# Patient Record
Sex: Female | Born: 1985 | Race: Black or African American | Hispanic: No | Marital: Single | Smoking: Never smoker
Health system: Southern US, Community
[De-identification: ages and names within clinical notes are randomized; demographics above are authoritative.]

## PROBLEM LIST (undated history)

## (undated) HISTORY — PX: KNEE SURGERY: SHX244

---

## 2007-08-05 ENCOUNTER — Emergency Department (HOSPITAL_COMMUNITY): Admission: EM | Admit: 2007-08-05 | Discharge: 2007-08-06 | Payer: Self-pay | Admitting: Emergency Medicine

## 2014-06-03 ENCOUNTER — Emergency Department (HOSPITAL_BASED_OUTPATIENT_CLINIC_OR_DEPARTMENT_OTHER)
Admission: EM | Admit: 2014-06-03 | Discharge: 2014-06-03 | Disposition: A | Discharge status: Home / Self Care | Payer: 59 | Attending: Emergency Medicine | Admitting: Emergency Medicine

## 2014-06-03 ENCOUNTER — Encounter (HOSPITAL_BASED_OUTPATIENT_CLINIC_OR_DEPARTMENT_OTHER): Payer: Self-pay | Admitting: *Deleted

## 2014-06-03 ENCOUNTER — Emergency Department (HOSPITAL_BASED_OUTPATIENT_CLINIC_OR_DEPARTMENT_OTHER): Payer: 59

## 2014-06-03 DIAGNOSIS — M791 Myalgia: Secondary | ICD-10-CM | POA: Diagnosis not present

## 2014-06-03 DIAGNOSIS — R509 Fever, unspecified: Secondary | ICD-10-CM | POA: Diagnosis present

## 2014-06-03 DIAGNOSIS — J069 Acute upper respiratory infection, unspecified: Secondary | ICD-10-CM | POA: Diagnosis not present

## 2014-06-03 DIAGNOSIS — R05 Cough: Secondary | ICD-10-CM

## 2014-06-03 DIAGNOSIS — R059 Cough, unspecified: Secondary | ICD-10-CM

## 2014-06-03 LAB — URINALYSIS, ROUTINE W REFLEX MICROSCOPIC
Bilirubin Urine: NEGATIVE
Glucose, UA: NEGATIVE mg/dL
HGB URINE DIPSTICK: NEGATIVE
Ketones, ur: NEGATIVE mg/dL
Leukocytes, UA: NEGATIVE
NITRITE: NEGATIVE
PROTEIN: NEGATIVE mg/dL
Specific Gravity, Urine: 1.023 (ref 1.005–1.030)
UROBILINOGEN UA: 1 mg/dL (ref 0.0–1.0)
pH: 6.5 (ref 5.0–8.0)

## 2014-06-03 LAB — PREGNANCY, URINE: Preg Test, Ur: NEGATIVE

## 2014-06-03 MED ORDER — IPRATROPIUM-ALBUTEROL 0.5-2.5 (3) MG/3ML IN SOLN
3.0000 mL | RESPIRATORY_TRACT | Status: DC
  Administered 2014-06-03: 3 mL via RESPIRATORY_TRACT
  Filled 2014-06-03: qty 3

## 2014-06-03 MED ORDER — IBUPROFEN 800 MG PO TABS: 800.0000 mg | ORAL_TABLET | Freq: Three times a day (TID) | ORAL | Status: DC

## 2014-06-03 MED ORDER — KETOROLAC TROMETHAMINE 60 MG/2ML IM SOLN
60.0000 mg | Freq: Once | INTRAMUSCULAR | Status: AC
  Administered 2014-06-03: 60 mg via INTRAMUSCULAR
  Filled 2014-06-03: qty 2

## 2014-06-03 MED ORDER — ACETAMINOPHEN 325 MG PO TABS
650.0000 mg | ORAL_TABLET | Freq: Once | ORAL | Status: AC
  Administered 2014-06-03: 650 mg via ORAL
  Filled 2014-06-03: qty 2

## 2014-06-03 NOTE — Discharge Instructions (Signed)
Upper Respiratory Infection, Adult °An upper respiratory infection (URI) is also sometimes known as the common cold. The upper respiratory tract includes the nose, sinuses, throat, trachea, and bronchi. Bronchi are the airways leading to the lungs. Most people improve within 1 week, but symptoms can last up to 2 weeks. A residual cough may last even longer.  °CAUSES °Many different viruses can infect the tissues lining the upper respiratory tract. The tissues become irritated and inflamed and often become very moist. Mucus production is also common. A cold is contagious. You can easily spread the virus to others by oral contact. This includes kissing, sharing a glass, coughing, or sneezing. Touching your mouth or nose and then touching a surface, which is then touched by another person, can also spread the virus. °SYMPTOMS  °Symptoms typically develop 1 to 3 days after you come in contact with a cold virus. Symptoms vary from person to person. They may include: °· Runny nose. °· Sneezing. °· Nasal congestion. °· Sinus irritation. °· Sore throat. °· Loss of voice (laryngitis). °· Cough. °· Fatigue. °· Muscle aches. °· Loss of appetite. °· Headache. °· Low-grade fever. °DIAGNOSIS  °You might diagnose your own cold based on familiar symptoms, since most people get a cold 2 to 3 times a year. Your caregiver can confirm this based on your exam. Most importantly, your caregiver can check that your symptoms are not due to another disease such as strep throat, sinusitis, pneumonia, asthma, or epiglottitis. Blood tests, throat tests, and X-rays are not necessary to diagnose a common cold, but they may sometimes be helpful in excluding other more serious diseases. Your caregiver will decide if any further tests are required. °RISKS AND COMPLICATIONS  °You may be at risk for a more severe case of the common cold if you smoke cigarettes, have chronic heart disease (such as heart failure) or lung disease (such as asthma), or if  you have a weakened immune system. The very young and very old are also at risk for more serious infections. Bacterial sinusitis, middle ear infections, and bacterial pneumonia can complicate the common cold. The common cold can worsen asthma and chronic obstructive pulmonary disease (COPD). Sometimes, these complications can require emergency medical care and may be life-threatening. °PREVENTION  °The best way to protect against getting a cold is to practice good hygiene. Avoid oral or hand contact with people with cold symptoms. Wash your hands often if contact occurs. There is no clear evidence that vitamin C, vitamin E, echinacea, or exercise reduces the chance of developing a cold. However, it is always recommended to get plenty of rest and practice good nutrition. °TREATMENT  °Treatment is directed at relieving symptoms. There is no cure. Antibiotics are not effective, because the infection is caused by a virus, not by bacteria. Treatment may include: °· Increased fluid intake. Sports drinks offer valuable electrolytes, sugars, and fluids. °· Breathing heated mist or steam (vaporizer or shower). °· Eating chicken soup or other clear broths, and maintaining good nutrition. °· Getting plenty of rest. °· Using gargles or lozenges for comfort. °· Controlling fevers with ibuprofen or acetaminophen as directed by your caregiver. °· Increasing usage of your inhaler if you have asthma. °Zinc gel and zinc lozenges, taken in the first 24 hours of the common cold, can shorten the duration and lessen the severity of symptoms. Pain medicines may help with fever, muscle aches, and throat pain. A variety of non-prescription medicines are available to treat congestion and runny nose. Your caregiver   can make recommendations and may suggest nasal or lung inhalers for other symptoms.  °HOME CARE INSTRUCTIONS  °· Only take over-the-counter or prescription medicines for pain, discomfort, or fever as directed by your  caregiver. °· Use a warm mist humidifier or inhale steam from a shower to increase air moisture. This may keep secretions moist and make it easier to breathe. °· Drink enough water and fluids to keep your urine clear or pale yellow. °· Rest as needed. °· Return to work when your temperature has returned to normal or as your caregiver advises. You may need to stay home longer to avoid infecting others. You can also use a face mask and careful hand washing to prevent spread of the virus. °SEEK MEDICAL CARE IF:  °· After the first few days, you feel you are getting worse rather than better. °· You need your caregiver's advice about medicines to control symptoms. °· You develop chills, worsening shortness of breath, or Ramiro or red sputum. These may be signs of pneumonia. °· You develop yellow or Sopko nasal discharge or pain in the face, especially when you bend forward. These may be signs of sinusitis. °· You develop a fever, swollen neck glands, pain with swallowing, or white areas in the back of your throat. These may be signs of strep throat. °SEEK IMMEDIATE MEDICAL CARE IF:  °· You have a fever. °· You develop severe or persistent headache, ear pain, sinus pain, or chest pain. °· You develop wheezing, a prolonged cough, cough up blood, or have a change in your usual mucus (if you have chronic lung disease). °· You develop sore muscles or a stiff neck. °Document Released: 08/31/2000 Document Revised: 05/30/2011 Document Reviewed: 06/12/2013 °ExitCare® Patient Information ©2015 [employer]. This information is not intended to replace advice given to you by your health care provider. Make sure you discuss any questions you have with your health care provider. ° °Emergency Department Resource Guide °1) Find a Doctor and Pay Out of Pocket °Although you won't have to find out who is covered by your insurance plan, it is a good idea to ask around and get recommendations. You will then need to call the office and see if  the doctor you have chosen will accept you as a new patient and what types of options they offer for patients who are self-pay. Some doctors offer discounts or will set up payment plans for their patients who do not have insurance, but you will need to ask so you aren't surprised when you get to your appointment. ° °2) Contact Your Local Health Department °Not all health departments have doctors that can see patients for sick visits, but many do, so it is worth a call to see if yours does. If you don't know where your local health department is, you can check in your phone book. The CDC also has a tool to help you locate your state's health department, and many state websites also have listings of all of their local health departments. ° °3) Find a Walk-in Clinic °If your illness is not likely to be very severe or complicated, you may want to try a walk in clinic. These are popping up all over the country in pharmacies, drugstores, and shopping centers. They're usually staffed by nurse practitioners or physician assistants that have been trained to treat common illnesses and complaints. They're usually fairly quick and inexpensive. However, if you have serious medical issues or chronic medical problems, these are probably not your best option. ° °  No Primary Care Doctor: °- Call [facility] at  832-8000 - they can help you locate a primary care doctor that  accepts your insurance, provides certain services, etc. °- Physician Referral Service- 1-800-533-3463 ° °Chronic Pain Problems: °Organization         Address  Phone   Notes  °[facility]  (336) 297-2271 Patients need to be referred by their primary care doctor.  ° °Medication Assistance: °Organization         Address  Phone   Notes  °[place] [facility] [address] °[place], [place] [address] (336) 641-8030 --Must be a resident of [place] °-- Must have NO insurance coverage whatsoever (no  Medicaid/ Medicare, etc.) °-- The pt. MUST have a primary care doctor that directs their care regularly and follows them in the community °  °[facility]  (866) 331-1348   °[employer]  (888) 892-1162   ° °Agencies that provide inexpensive medical care: °Organization         Address  Phone   Notes  °[facility]  (336) 832-8035   °[facility]    (336) 832-7272   °[facility] [address] °[place], [place] [address] (336) 832-4777   °[facility] [address], °[place] (336) 271-4999   °[facility]    (336) 373-0678   °[facility]    (336) 272-1050   °[facility] ° [address], [place] Phone:  (336) 832-4444, Fax:  (336) 832-4440 Hours of Operation:  9 am - 6 pm, M-F.  Also accepts Medicaid/Medicare and self-pay.  °[facility] ° [address], [place] Phone: (336) 832-3150, Fax: (336) 832-3151. Hours of Operation:  8:30 am - 5:30 pm, M-F.  Also accepts Medicaid and self-pay.  °[facility] [place] [address], [place] Phone: (336) 878-6027   °[facility] [address], [place], [place] (336)723-1848, Ext. 123 Mondays & Thursdays: 7-9 AM.  First 15 patients are seen on a first come, first serve basis. °  ° °Medicaid-accepting [place] Providers: ° °Organization         Address  Phone   Notes  °[facility] [address], [place] (336) 641-2100 Also accepts self-pay patients.  °[facility] [address], [place] ° (336) 856-9996   °[facility] [address], [place] (336) 288-8857   °[facility] [address] [place] [address], [place] (336) 299-7000   °Veita Bland [address], [place]  ° (336) 373-1557 Only accepts [place] Medicaid patients after they have their name applied to their card.   ° °Self-Pay (no insurance) in [place]: ° °Organization         Address  Phone   Notes  °Sickle Cell Patients, [facility] [address], [place] (336) 832-1970   °[facility] [address], [place] (336) 832-4400   °[facility] ° [address], [place] (336) 992-4800   °[facility]/Dr. Osei-Bonsu ° [address] [place] [address], [place] or [address], [place] (336) 841-8500 Phone number for both [place] and [place] locations is the same.  °[facility] [address], [place] (336) 299-0000   °  [facility] [address] [place] [address], [place] or [address] (336) 852-7530 °(336) 878-2260   °[facility] [address], [place] (336) 350-1642, phone; (336) 294-5005, fax Sees patients 1st and 3rd Saturday of every month.  Must not qualify for public or private insurance (i.e. Medicaid, Medicare, [place] Health Choice, Veterans' Benefits) • Household income should be no more than 200% of the poverty level •The clinic cannot treat you if you are pregnant or think you are pregnant • Sexually transmitted diseases are not treated at the clinic.  ° ° °Dental Care: °Organization         Address  Phone  Notes  °[facility] [address], [place] (336) 641-6152 Accepts children up to age 21 who are enrolled in Medicaid or [place] Health Choice; pregnant women with a Medicaid card; and children who have applied for Medicaid or [place] Health Choice, but were declined, whose parents can pay a reduced fee at time of service.  °[facility] [place]  [address], [place] (336) 641-7733 Accepts children up to age 21 who are enrolled in Medicaid or [place] Health Choice; pregnant women with a Medicaid card; and children who have applied for Medicaid or [place] Health Choice,  but were declined, whose parents can pay a reduced fee at time of service.  °[facility] ° [address], [place] (336) 641-4533 Patients are seen by appointment only. Walk-ins are not accepted. [facility] will see patients 18 years of age and older. °Monday - Tuesday (8am-5pm) °Most Wednesdays (8:30-5pm) °$30 per visit, cash only  °[facility] ° [address], [place] (336) 641-4533 Patients are seen by appointment only. Walk-ins are not accepted. [facility] will see patients 18 years of age and older. °One Wednesday Evening (Monthly: Volunteer Based).  $30 per visit, cash only  °[facility]  (919) 537-3737 for adults; Children under age 4, call Graduate Pediatric Dentistry at (919) [phone]. Children aged 4-14, please call (919) [phone] to request a pediatric application. ° Dental services are provided in all areas of dental care including fillings, crowns and bridges, complete and partial dentures, implants, gum treatment, root canals, and extractions. Preventive care is also provided. Treatment is provided to both adults and children. °Patients are selected via a lottery and there is often a waiting list. °  °[facility] [address], °[place] ° (336) 763-8833 [email or web address] °  °[facility] [address], [place], [place] (336)723-1848, Ext. 123 Second and Fourth Thursday of each month, opens at 6:30 AM; Clinic ends at 9 AM.  Patients are seen on a first-come first-served basis, and a limited number are seen during each clinic.  ° °[facility] ° [address], [place], [place] (336) 723-7904   Eligibility Requirements °You must have lived in [place], [place], or [place] counties for at least the last three months. °  You cannot be eligible for state or federal sponsored healthcare insurance, including Veterans Administration, Medicaid, or Medicare. °  You generally  cannot be eligible for healthcare insurance through your employer.  °  How to apply: °Eligibility screenings are held every Tuesday and Wednesday afternoon from 1:00 pm until 4:00 pm. You do not need an appointment for the interview!  °[facility] [address], [place], [place] 336-631-2330   °  [facility] [facility]  336-342-8273   °[facility]  336-703-3100   °[facility]  336-570-6415   ° °Behavioral Health Resources in the Community: °Intensive Outpatient Programs °Organization         Address  Phone  Notes  °[facility] [address], [place], [place] 336-878-6098   °[facility] [address], [place], [place] 336-832-9800   °[facility] [address], [place], [place] ° 336-882-2125   °[facility] [address],  °[place], [place] 1-800-853-5163 or 336-641-4981   °Substance Abuse Resources °Organization         Address  Phone  Notes  °[facility]  336-882-2125   °[facility]  336-784-9470   °[facility]  336-285-9073   °[facility]  336-845-3988   °Residential & Outpatient Substance Abuse Program  1-800-659-3381   °Psychological Services °Organization         Address  Phone  Notes  °[facility]  336- 832-9600   °[facility]  336- 378-7881   °[facility] [address], [place] 1-800-853-5163 or 336-641-4981   ° °Mobile Crisis Teams °Organization         Address  Phone  Notes  °[facility], Mobile Crisis Care Unit  1-877-626-1772   °Assertive °[facility] ° [address] [place], [place] 336-834-9664   °Sharon DeEsch [address] °[place] 336-554-5454   ° °Self-Help/Support Groups °Organization         Address  Phone             Notes  °[facility] - variety of support groups  336- 373-1402 Call for more  information  °Narcotics Anonymous (NA), [facility] [address], °[place]  2 meetings at this location  ° °Residential Treatment Programs °Organization         Address  Phone  Notes  °[facility] [address],    °[place]  1-866-801-8205   °[facility] ° [address], [place], [place] 704-293-8524   °[facility] [address], [place] 336-845-3988 Admissions: 8am-3pm M-F  °[facility] [address]    °[place], [place] 336-841-1104   °[facility] [address], [place], [place] 336-379-7146   °[facility] [address]  °[place], [place] 336-285-9073   °[facility] - Intensive Outpatient [address], [place], [place] 336-852-3033   °[facility] [address]  °[place], [place] 1-877-615-2722 or 336-784-9470   °[facility] [address] [place], [place] 336-227-7417 Accepts Medicaid  °[facility] [address]  °[place] 1-800-659-3381 Substance Abuse/Addiction Treatment  ° °[place] Behavioral Health Resources °Organization         Address  Phone  Notes  °[facility]  (888) 581-9988   °Julie Brannon, PhD [address] [place], [place]   (336) 349-5553 or (336) 951-0000   °[facility]   [address] °[place], [place] (336) 349-4454   °[facility] [address], [place], [place] (336) 342-8316 Insurance/Medicaid/sponsorship through [facility]  °[facility] [address]                                      [place], [place] (336) 342-8316 Therapy/tele-psych/case  °[facility] [address]  ° [place], [place] (336) 349-2233    °Dr. Arfeen  (336) 349-4544   °[facility] [place]  [employer] [place] [facility] 1) [address], [place] °2) [address], [place] °3)  [address], [place] (336)  349-3220 °(336) 342-7768 ° °(336) 342-8140   °[place] [facility] (336) 342-1394 or (336) 342-3537 (After Hours)    ° ° °

## 2014-06-03 NOTE — ED Notes (Signed)
Ice water given per pt request

## 2014-06-03 NOTE — ED Provider Notes (Signed)
CSN: [id number]     Arrival date & time 06/03/14  1523 History   First MD Initiated Contact with Patient 06/03/14 1536     Chief Complaint  Patient presents with  . Fever     (Consider location/radiation/quality/duration/timing/severity/associated sxs/prior Treatment) Patient is a 29 y.o. female presenting with fever. The history is provided by the patient.  Fever Temp source:  Subjective Severity:  Moderate Onset quality:  Gradual Duration:  1 day Timing:  Constant Progression:  Unchanged Chronicity:  New Relieved by:  Nothing Worsened by:  Nothing tried Associated symptoms: chills, cough and myalgias (diffuse)   Associated symptoms: no congestion, no rash, no rhinorrhea, no sore throat and no vomiting     History reviewed. No pertinent past medical history. Past Surgical History  Procedure Laterality Date  . Knee surgery     No family history on file. History  Substance Use Topics  . Smoking status: Never Smoker   . Smokeless tobacco: Not on file  . Alcohol Use: Yes   OB History    No data available     Review of Systems  Constitutional: Positive for fever and chills.  HENT: Negative for congestion, rhinorrhea and sore throat.   Respiratory: Positive for cough and shortness of breath.   Gastrointestinal: Negative for vomiting and abdominal pain.  Musculoskeletal: Positive for myalgias (diffuse).  Skin: Negative for rash.  All other systems reviewed and are negative.     Allergies  Review of patient's allergies indicates no known allergies.  Home Medications   Prior to Admission medications   Not on File   BP 128/84 mmHg  Pulse 92  Temp(Src) 102 F (38.9 C) (Oral)  Resp 18  Ht 5\' 3"  (1.6 m)  Wt 265 lb (120.203 kg)  BMI 46.95 kg/m2  SpO2 100%  LMP 05/27/2014 Physical Exam  Constitutional: She is oriented to person, place, and time. She appears well-developed and well-nourished. No distress.  HENT:  Head: Normocephalic and atraumatic.   Mouth/Throat: Oropharynx is clear and moist.  Eyes: EOM are normal. Pupils are equal, round, and reactive to light.  Neck: Normal range of motion. Neck supple.  Cardiovascular: Normal rate and regular rhythm.  Exam reveals no friction rub.   No murmur heard. Pulmonary/Chest: Effort normal. No respiratory distress. She has wheezes (very mild, occasional). She has no rales.  Abdominal: Soft. She exhibits no distension. There is no tenderness. There is no rebound.  Musculoskeletal: Normal range of motion. She exhibits no edema.  Neurological: She is alert and oriented to person, place, and time.  Skin: No rash noted. She is not diaphoretic.  Nursing note and vitals reviewed.   ED Course  Procedures (including critical care time) Labs Review Labs Reviewed  URINALYSIS, ROUTINE W REFLEX MICROSCOPIC  PREGNANCY, URINE    Imaging Review Dg Chest 2 View  06/03/2014   CLINICAL DATA:  Shortness of breath with fever and cough 2 days.  EXAM: CHEST  2 VIEW  COMPARISON:  None.  FINDINGS: Lungs are adequately inflated without focal consolidation or effusion. Cardiomediastinal silhouette, bones and soft tissues are within normal.  IMPRESSION: No active cardiopulmonary disease.   Electronically Signed   By: Elberta Fortisaniel  Boyle M.D.   On: 06/03/2014 16:39     EKG Interpretation None      MDM   Final diagnoses:  Cough  Viral upper respiratory illness    29 year old female here with cough, fever, headache, bodyaches. No sick contacts with similar illness. Began yesterday. She's coughing a  lot during our exam. She feels that she is wheezing. She has no asthma, COPD, smoking history. Here febrile, otherwise vitals are stable. She is well-appearing. Neck is supple with full range of motion without any nuchal rigidity. No meningeal signs. No concern for meningitis. Mild erythema posterior oropharynx, but she denies any sore throat. Lungs with a very occasional wheeze, no adventitious sounds otherwise. I  think she has an influenza type illness. Offered IV fluids, patient would rather take by mouth. Will give 60 mg IM of Toradol and check a chest x-ray. CXR clear. Feeling better after PO hydration and toradol. Counseled on supportive care. Stable for discharge.   Elwin Mocha, MD 06/03/14 [phone]

## 2014-06-03 NOTE — ED Notes (Signed)
Fever, headache, body aches since yesterday.

## 2015-04-11 ENCOUNTER — Encounter (HOSPITAL_BASED_OUTPATIENT_CLINIC_OR_DEPARTMENT_OTHER): Payer: Self-pay | Admitting: *Deleted

## 2015-04-11 ENCOUNTER — Emergency Department (HOSPITAL_BASED_OUTPATIENT_CLINIC_OR_DEPARTMENT_OTHER)
Admission: EM | Admit: 2015-04-11 | Discharge: 2015-04-11 | Disposition: A | Discharge status: Home / Self Care | Payer: 59 | Attending: Emergency Medicine | Admitting: Emergency Medicine

## 2015-04-11 ENCOUNTER — Emergency Department (HOSPITAL_BASED_OUTPATIENT_CLINIC_OR_DEPARTMENT_OTHER): Payer: 59

## 2015-04-11 DIAGNOSIS — Z23 Encounter for immunization: Secondary | ICD-10-CM | POA: Insufficient documentation

## 2015-04-11 DIAGNOSIS — Y9389 Activity, other specified: Secondary | ICD-10-CM | POA: Insufficient documentation

## 2015-04-11 DIAGNOSIS — Y998 Other external cause status: Secondary | ICD-10-CM | POA: Diagnosis not present

## 2015-04-11 DIAGNOSIS — W1839XA Other fall on same level, initial encounter: Secondary | ICD-10-CM | POA: Diagnosis not present

## 2015-04-11 DIAGNOSIS — Z791 Long term (current) use of non-steroidal anti-inflammatories (NSAID): Secondary | ICD-10-CM | POA: Insufficient documentation

## 2015-04-11 DIAGNOSIS — R52 Pain, unspecified: Secondary | ICD-10-CM

## 2015-04-11 DIAGNOSIS — Y9289 Other specified places as the place of occurrence of the external cause: Secondary | ICD-10-CM | POA: Diagnosis not present

## 2015-04-11 DIAGNOSIS — W19XXXA Unspecified fall, initial encounter: Secondary | ICD-10-CM

## 2015-04-11 DIAGNOSIS — S6391XA Sprain of unspecified part of right wrist and hand, initial encounter: Secondary | ICD-10-CM | POA: Insufficient documentation

## 2015-04-11 DIAGNOSIS — S6991XA Unspecified injury of right wrist, hand and finger(s), initial encounter: Secondary | ICD-10-CM | POA: Diagnosis present

## 2015-04-11 MED ORDER — TETANUS-DIPHTH-ACELL PERTUSSIS 5-2.5-18.5 LF-MCG/0.5 IM SUSP
0.5000 mL | Freq: Once | INTRAMUSCULAR | Status: AC
  Administered 2015-04-11: 0.5 mL via INTRAMUSCULAR
  Filled 2015-04-11: qty 0.5

## 2015-04-11 MED ORDER — HYDROCODONE-ACETAMINOPHEN 5-325 MG PO TABS: 1.0000 | ORAL_TABLET | Freq: Four times a day (QID) | ORAL | Status: AC | PRN

## 2015-04-11 NOTE — ED Notes (Signed)
Pt fell c/o rt hand pain  Slight swelling  Ice pack applied

## 2015-04-11 NOTE — ED Notes (Signed)
Pt fell landing on rt hand  Increased pain  Slight swelling   Denies any other inj

## 2015-04-11 NOTE — ED Provider Notes (Signed)
CSN: [id number]     Arrival date & time April 28, 2015  0330 History   First MD Initiated Contact with Patient 04-28-2015 0350     Chief Complaint  Patient presents with  . Hand Injury     (Consider location/radiation/quality/duration/timing/severity/associated sxs/prior Treatment) HPI  This is a 30 year old female who fell about 245 this morning. She fell onto her outstretched right hand. She is having pain in her right thenar eminence and thumb. Pain is worse with movement or palpation. She rates it as a 9 out of 10. There is minimal associated swelling. She denies other injury.  History reviewed. No pertinent past medical history. Past Surgical History  Procedure Laterality Date  . Knee surgery     No family history on file. Social History  Substance Use Topics  . Smoking status: Never Smoker   . Smokeless tobacco: None  . Alcohol Use: Yes   OB History    No data available     Review of Systems  All other systems reviewed and are negative.   Allergies  Review of patient's allergies indicates no known allergies.  Home Medications   Prior to Admission medications   Medication Sig Start Date End Date Taking? Authorizing Provider  ibuprofen (ADVIL,MOTRIN) 800 MG tablet Take 1 tablet (800 mg total) by mouth 3 (three) times daily. 06/03/14   Elwin Mocha, MD   BP 130/66 mmHg  Pulse 98  Temp(Src) 98.6 F (37 C) (Oral)  Resp 20  Ht  (1.6 m)  Wt 270 lb (122.471 kg)  BMI 47.84 kg/m2  SpO2 100%   Physical Exam  General: Well-developed, well-nourished female in no acute distress; appearance consistent with age of record HENT: normocephalic; atraumatic Eyes: pupils equal, round and reactive to light; extraocular muscles intact Neck: supple; no C-spine tenderness Heart: regular rate and rhythm Lungs: clear to auscultation bilaterally Abdomen: soft; nondistended; nontender Extremities: No deformity; full range of motion; pulses normal; tenderness of right thenar eminence  and anatomic snuffbox Neurologic: Awake, alert and oriented; motor function intact in all extremities and symmetric; no facial droop Skin: Warm and dry; superficial laceration of the right palm just proximal to the second MCP joint Psychiatric: Normal mood and affect    ED Course  Procedures (including critical care time)   MDM  Nursing notes and vitals signs, including pulse oximetry, reviewed.  Summary of this visit's results, reviewed by myself:  Imaging Studies: Dg Hand Complete Right  2015/04/28  CLINICAL DATA:  30 year old female with fall and trauma to the base of the first metacarpal. EXAM: RIGHT HAND - COMPLETE 3+ VIEW COMPARISON:  Radiograph dated 08/06/2007 FINDINGS: There is no evidence of fracture or dislocation. There is no evidence of arthropathy or other focal bone abnormality. Soft tissues are unremarkable. IMPRESSION: Negative. Electronically Signed   By: Elgie Collard M.D.   On: Apr 28, 2015 04:31   Will treat for possible occult navicular fracture.    Paula Libra, MD 28-Apr-2015 [phone]

## 2017-11-25 IMAGING — CR DG HAND COMPLETE 3+V*R*
3 series · 3 of 3 positions shown · non-contrast
Comparison: Radiograph dated 08/06/2007

CLINICAL DATA: 29-year-old female with fall and trauma to the base
of the first metacarpal.

EXAM:
RIGHT HAND - COMPLETE 3+ VIEW

[x hand pa right]
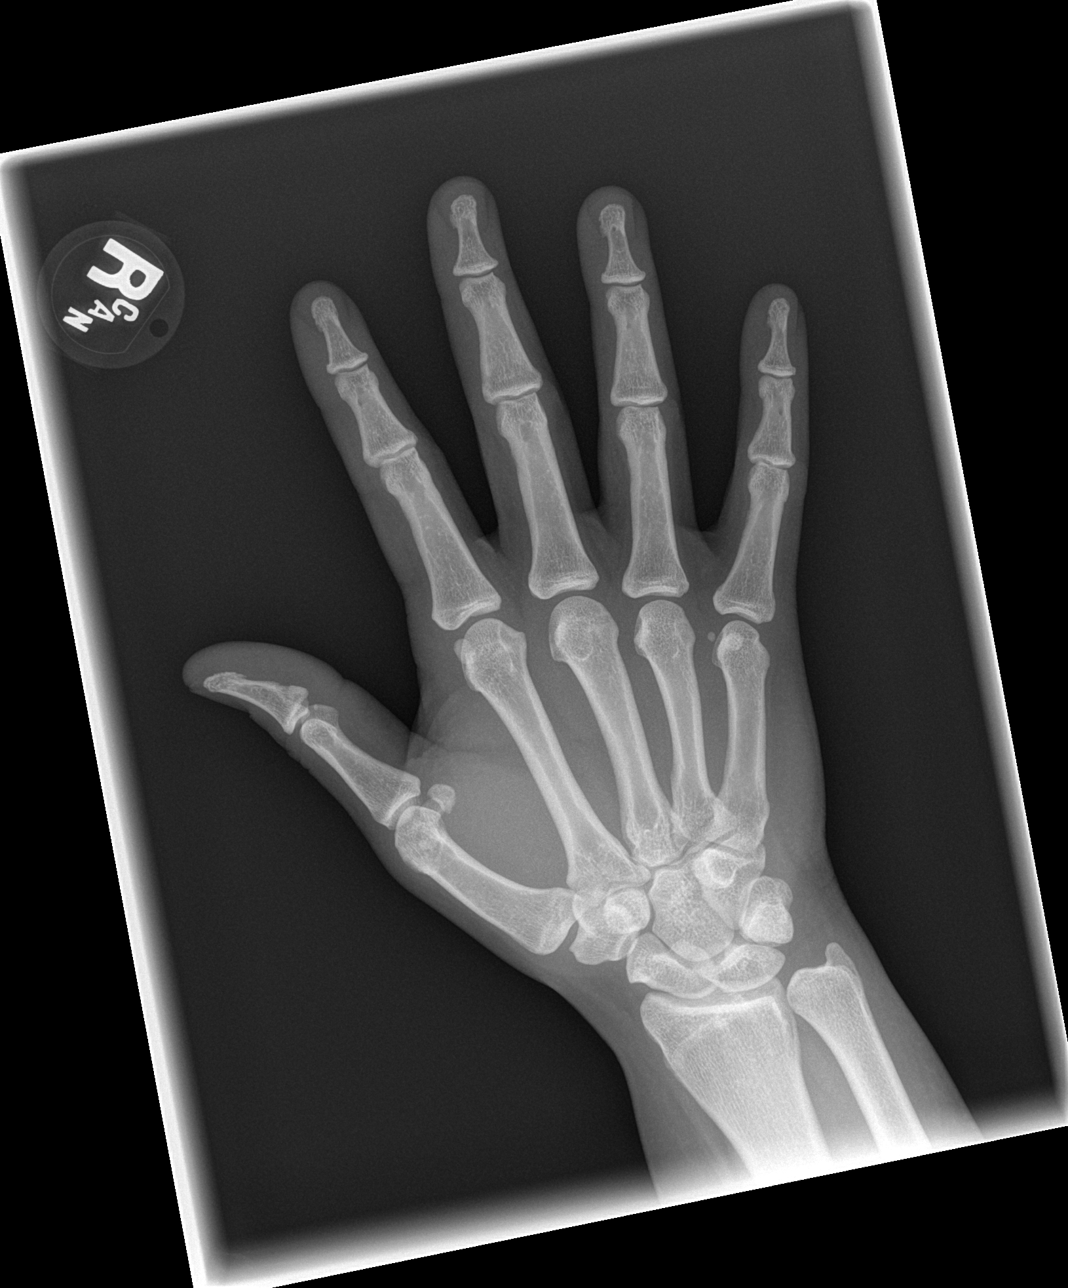

[x hand oblique right]
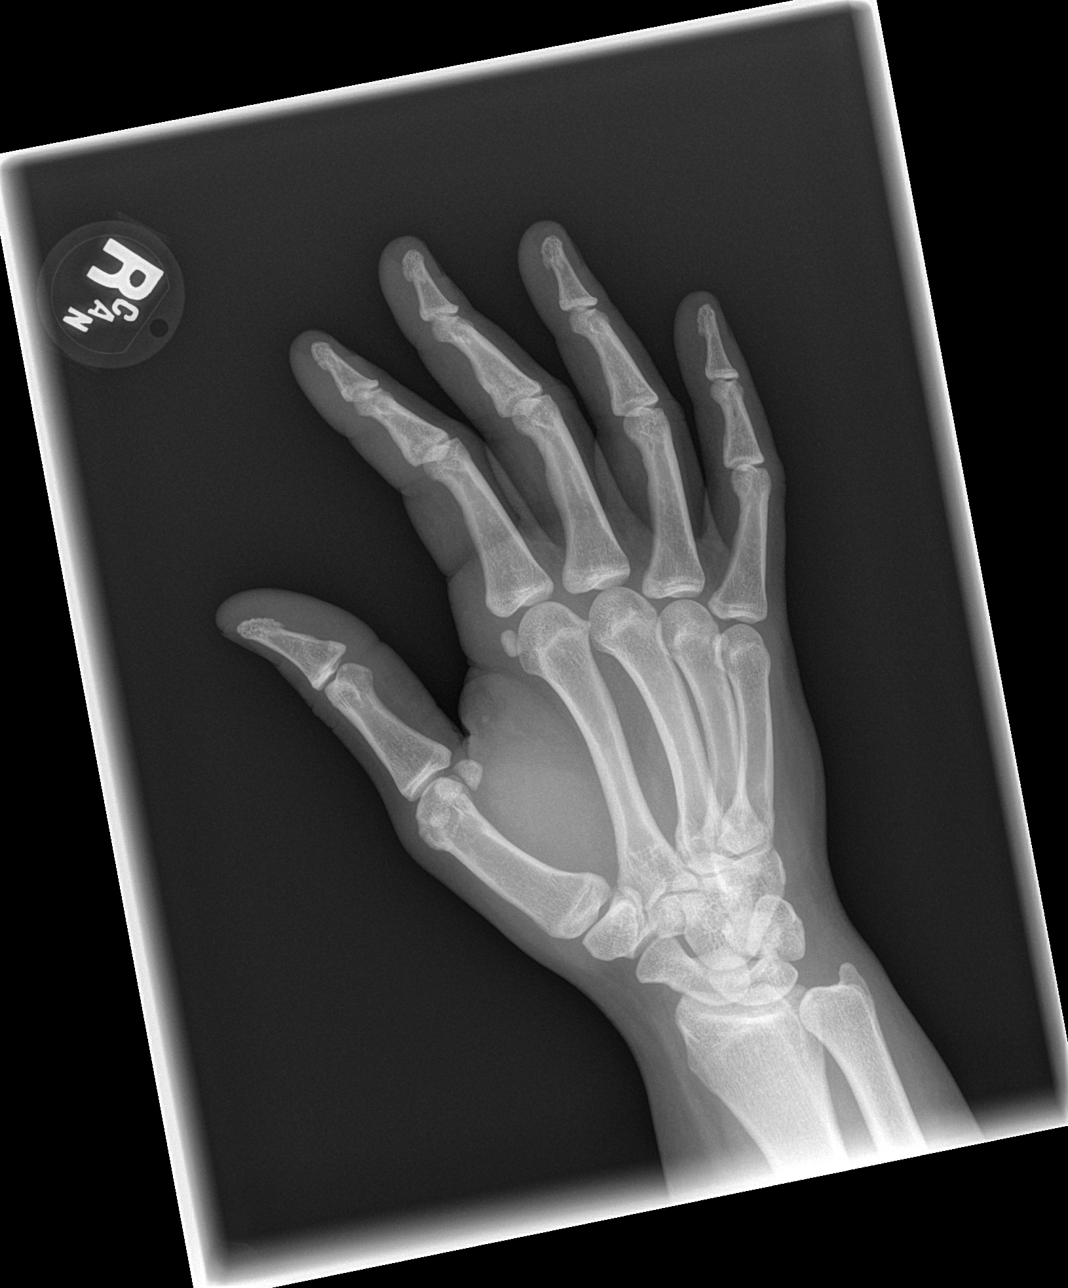

[x hand lat right]
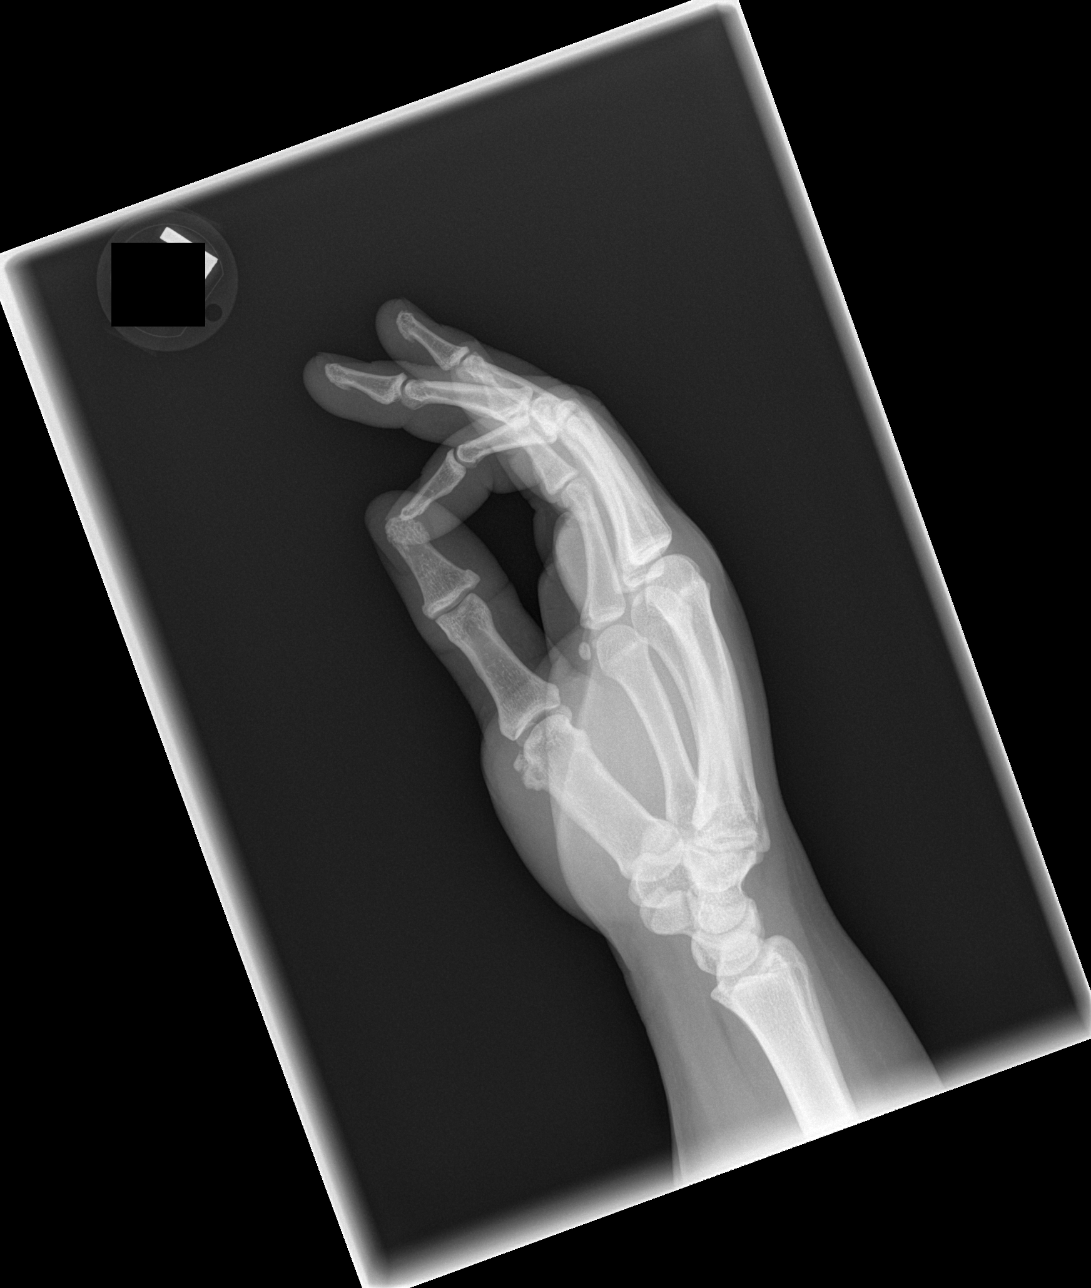

[3 of 3 positions shown; findings below may reference images not displayed]

FINDINGS: There is no evidence of fracture or dislocation. There is no
evidence of arthropathy or other focal bone abnormality. Soft
tissues are unremarkable.
IMPRESSION: Negative.
